# Patient Record
Sex: Male | Born: 1955 | Race: White | Hispanic: No | Marital: Married
Health system: Southern US, Community
[De-identification: ages and names within clinical notes are randomized; demographics above are authoritative.]

## PROBLEM LIST (undated history)

## (undated) DIAGNOSIS — I10 Essential (primary) hypertension: Secondary | ICD-10-CM

---

## 2014-01-11 ENCOUNTER — Other Ambulatory Visit: Payer: Self-pay | Admitting: Gastroenterology

## 2014-01-11 DIAGNOSIS — K639 Disease of intestine, unspecified: Secondary | ICD-10-CM

## 2014-01-22 ENCOUNTER — Ambulatory Visit
Admission: RE | Admit: 2014-01-22 | Discharge: 2014-01-22 | Disposition: A | Discharge status: Home / Self Care | Payer: BC Managed Care – PPO | Source: Ambulatory Visit | Attending: Gastroenterology | Admitting: Gastroenterology

## 2014-01-22 DIAGNOSIS — K639 Disease of intestine, unspecified: Secondary | ICD-10-CM

## 2014-01-22 MED ORDER — GADOBENATE DIMEGLUMINE 529 MG/ML IV SOLN
20.0000 mL | Freq: Once | INTRAVENOUS | Status: AC | PRN
  Administered 2014-01-22: 20 mL via INTRAVENOUS

## 2014-08-31 ENCOUNTER — Ambulatory Visit: Payer: Self-pay | Admitting: Rheumatology

## 2015-03-27 ENCOUNTER — Ambulatory Visit
Admission: RE | Admit: 2015-03-27 | Discharge: 2015-03-27 | Disposition: A | Discharge status: Home / Self Care | Payer: 59 | Source: Ambulatory Visit | Attending: Internal Medicine | Admitting: Internal Medicine

## 2015-03-27 ENCOUNTER — Other Ambulatory Visit: Payer: Self-pay | Admitting: Internal Medicine

## 2015-03-27 DIAGNOSIS — I2510 Atherosclerotic heart disease of native coronary artery without angina pectoris: Secondary | ICD-10-CM | POA: Insufficient documentation

## 2015-03-27 DIAGNOSIS — R042 Hemoptysis: Secondary | ICD-10-CM | POA: Diagnosis not present

## 2015-03-27 HISTORY — DX: Essential (primary) hypertension: I10

## 2015-03-27 LAB — POCT I-STAT CREATININE: CREATININE: 1.3 mg/dL — AB (ref 0.61–1.24)

## 2015-03-27 MED ORDER — IOHEXOL 350 MG/ML SOLN
100.0000 mL | Freq: Once | INTRAVENOUS | Status: AC | PRN
  Administered 2015-03-27: 100 mL via INTRAVENOUS

## 2015-03-29 ENCOUNTER — Other Ambulatory Visit: Payer: Self-pay | Admitting: Internal Medicine

## 2015-03-29 DIAGNOSIS — IMO0002 Reserved for concepts with insufficient information to code with codable children: Secondary | ICD-10-CM

## 2015-04-09 ENCOUNTER — Ambulatory Visit: Payer: 59

## 2015-04-17 ENCOUNTER — Ambulatory Visit: Payer: 59

## 2015-07-23 ENCOUNTER — Encounter: Payer: Self-pay | Admitting: Internal Medicine

## 2015-08-06 ENCOUNTER — Ambulatory Visit: Payer: Self-pay

## 2016-02-10 ENCOUNTER — Other Ambulatory Visit: Payer: Self-pay | Admitting: Internal Medicine

## 2016-02-10 DIAGNOSIS — M5416 Radiculopathy, lumbar region: Secondary | ICD-10-CM

## 2016-02-27 ENCOUNTER — Ambulatory Visit
Admission: RE | Admit: 2016-02-27 | Discharge: 2016-02-27 | Disposition: A | Discharge status: Home / Self Care | Payer: BLUE CROSS/BLUE SHIELD | Source: Ambulatory Visit | Attending: Internal Medicine | Admitting: Internal Medicine

## 2016-02-27 DIAGNOSIS — M4806 Spinal stenosis, lumbar region: Secondary | ICD-10-CM | POA: Insufficient documentation

## 2016-02-27 DIAGNOSIS — M5416 Radiculopathy, lumbar region: Secondary | ICD-10-CM | POA: Diagnosis not present

## 2016-02-27 MED ORDER — GADOBENATE DIMEGLUMINE 529 MG/ML IV SOLN
20.0000 mL | Freq: Once | INTRAVENOUS | Status: AC | PRN
  Administered 2016-02-27: 20 mL via INTRAVENOUS

## 2016-04-23 ENCOUNTER — Other Ambulatory Visit: Payer: Self-pay | Admitting: Physician Assistant

## 2016-04-23 ENCOUNTER — Ambulatory Visit
Admission: RE | Admit: 2016-04-23 | Discharge: 2016-04-23 | Disposition: A | Discharge status: Home / Self Care | Payer: BLUE CROSS/BLUE SHIELD | Source: Ambulatory Visit | Attending: Physician Assistant | Admitting: Physician Assistant

## 2016-04-23 DIAGNOSIS — M7989 Other specified soft tissue disorders: Secondary | ICD-10-CM

## 2016-04-23 DIAGNOSIS — M7121 Synovial cyst of popliteal space [Baker], right knee: Secondary | ICD-10-CM | POA: Diagnosis not present

## 2016-04-23 DIAGNOSIS — G8929 Other chronic pain: Secondary | ICD-10-CM | POA: Insufficient documentation

## 2016-04-23 DIAGNOSIS — M79604 Pain in right leg: Secondary | ICD-10-CM | POA: Diagnosis present

## 2016-05-11 ENCOUNTER — Other Ambulatory Visit: Payer: Self-pay | Admitting: Neurological Surgery

## 2016-05-11 DIAGNOSIS — M9963 Osseous and subluxation stenosis of intervertebral foramina of lumbar region: Secondary | ICD-10-CM

## 2016-05-12 ENCOUNTER — Ambulatory Visit
Admission: RE | Admit: 2016-05-12 | Discharge: 2016-05-12 | Disposition: A | Discharge status: Home / Self Care | Payer: BLUE CROSS/BLUE SHIELD | Source: Ambulatory Visit | Attending: Neurological Surgery | Admitting: Neurological Surgery

## 2016-05-12 DIAGNOSIS — M5418 Radiculopathy, sacral and sacrococcygeal region: Secondary | ICD-10-CM | POA: Diagnosis not present

## 2016-05-12 DIAGNOSIS — M9963 Osseous and subluxation stenosis of intervertebral foramina of lumbar region: Secondary | ICD-10-CM | POA: Diagnosis present

## 2016-05-12 DIAGNOSIS — M48061 Spinal stenosis, lumbar region without neurogenic claudication: Secondary | ICD-10-CM | POA: Insufficient documentation

## 2016-05-12 MED ORDER — GADOBENATE DIMEGLUMINE 529 MG/ML IV SOLN
20.0000 mL | Freq: Once | INTRAVENOUS | Status: AC | PRN
  Administered 2016-05-12: 20 mL via INTRAVENOUS

## 2017-06-10 IMAGING — MR MR LUMBAR SPINE WO/W CM
4 of 7 series · 14 of 48 positions shown · IV contrast (multihance)
Comparison: 02/27/2016

CLINICAL DATA: Postoperative pain. Low back pain extends down the
right leg with numbness. Lumbar spine decompression 04/14/2016

EXAM:
MRI LUMBAR SPINE WITHOUT AND WITH CONTRAST
TECHNIQUE: Multiplanar and multiecho pulse sequences of the lumbar spine were
obtained without and with intravenous contrast.
CONTRAST:  20mL MULTIHANCE GADOBENATE DIMEGLUMINE 529 MG/ML IV SOLN

[Series 2: T2 · sagittal · 4.0mm · 0.44mm/px · 5 of 18 slices shown (1 of 2)]
[im 1/18]
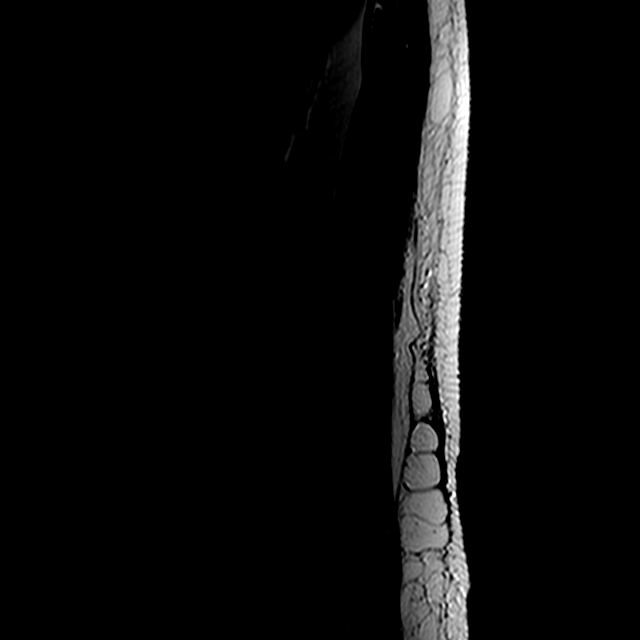
[im 5/18]
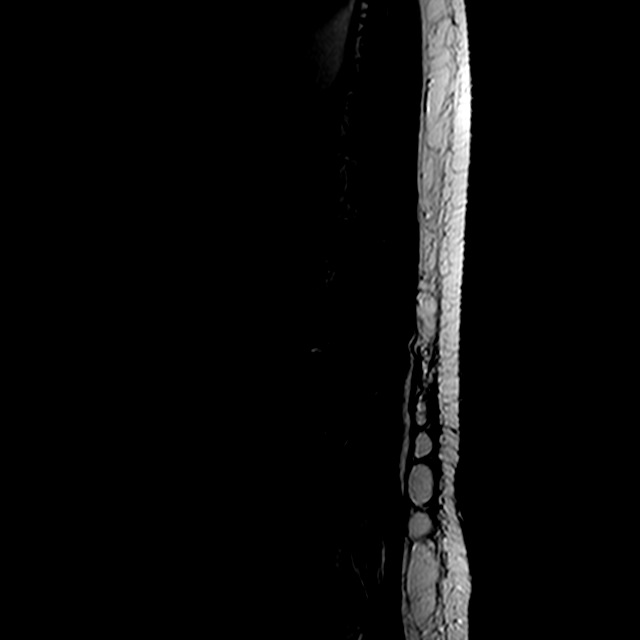
[im 9/18]
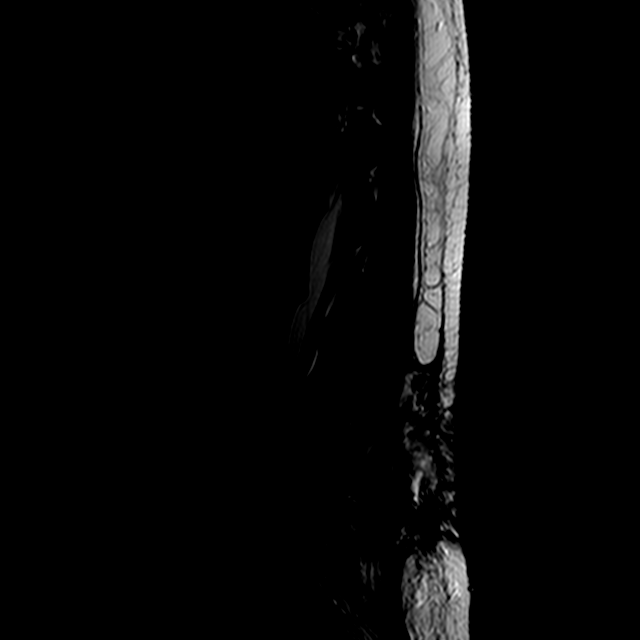
[im 13/18]
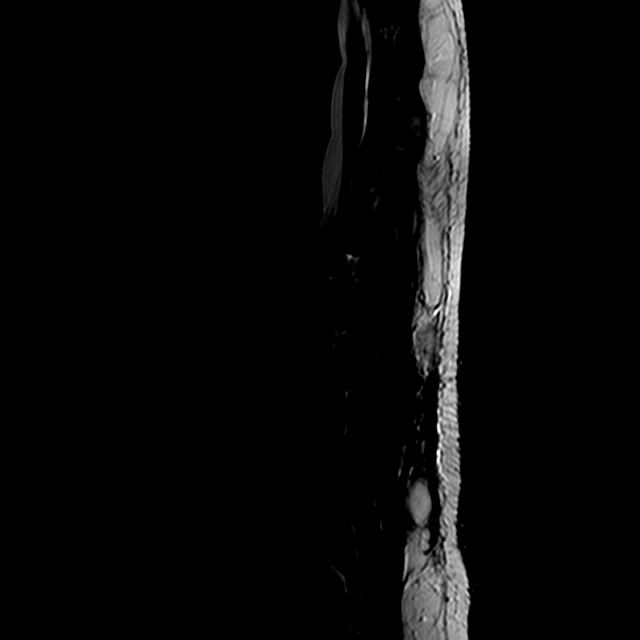
[im 18/18]
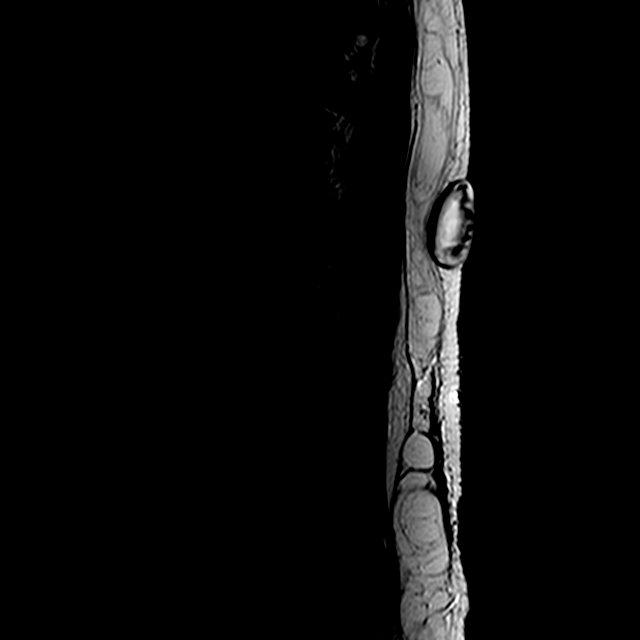

[Series 3: T1 · sagittal · 4.0mm · 0.44mm/px · 3 of 18 slices shown (1 of 2)]
[im 1/18]
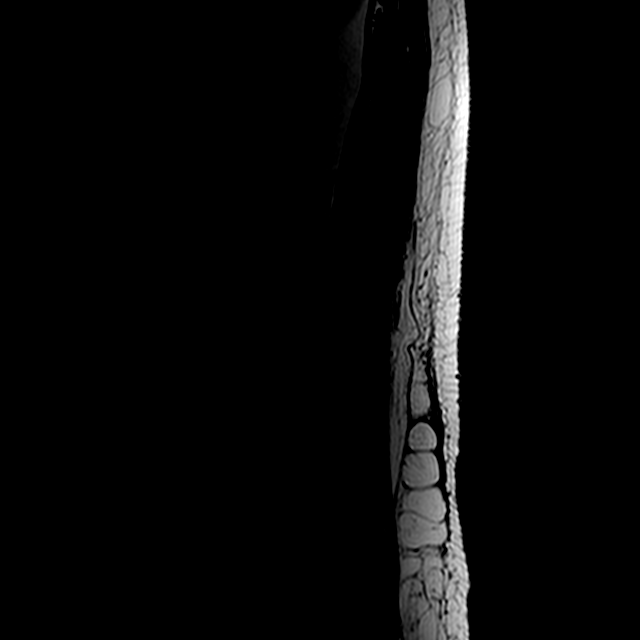
[im 9/18]
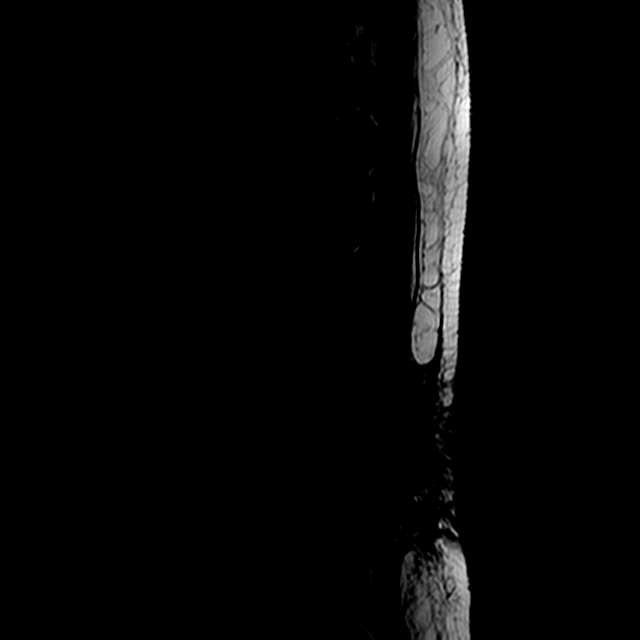
[im 18/18]
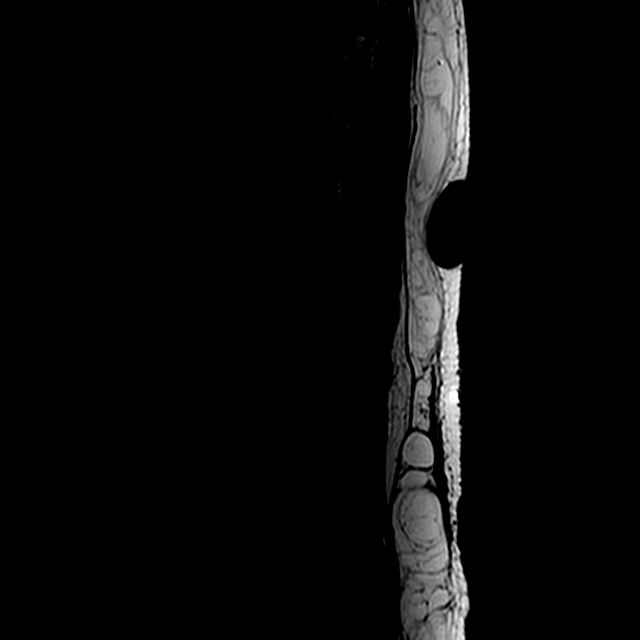

[Series 5: T2 · axial · 4.0mm · 0.39mm/px · z∈[-71,+114]mm · 3 of 41 slices shown (2 of 2)]
[im 5/41]
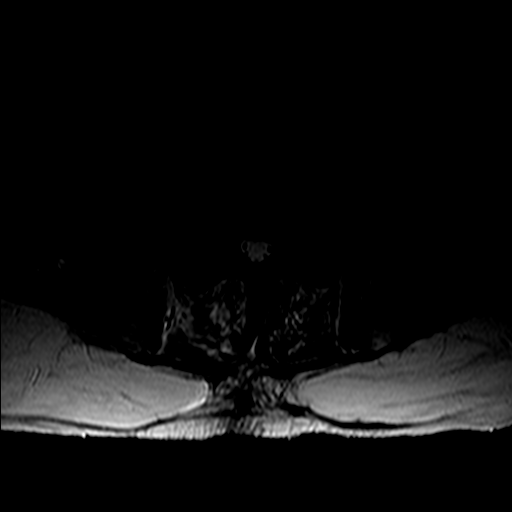
[im 23/41]
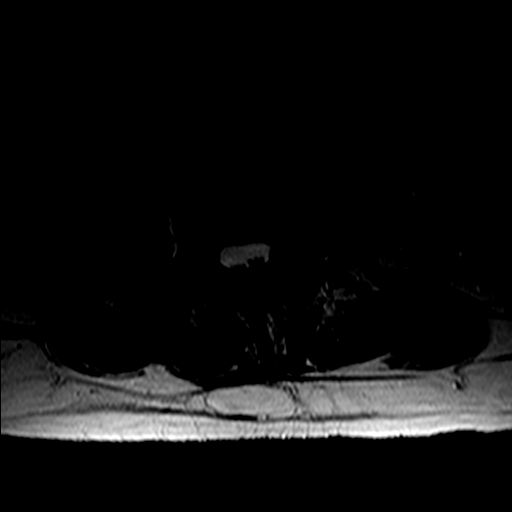
[im 36/41]
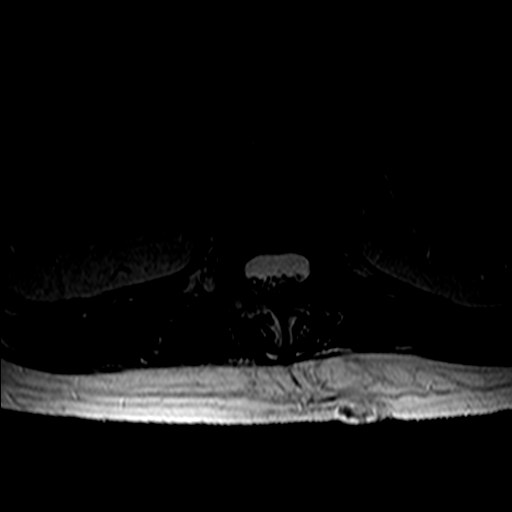

[Series 6: T1 · axial · 4.0mm · 0.39mm/px · z∈[-71,+114]mm · 3 of 41 slices shown (2 of 2)]
[im 5/41]
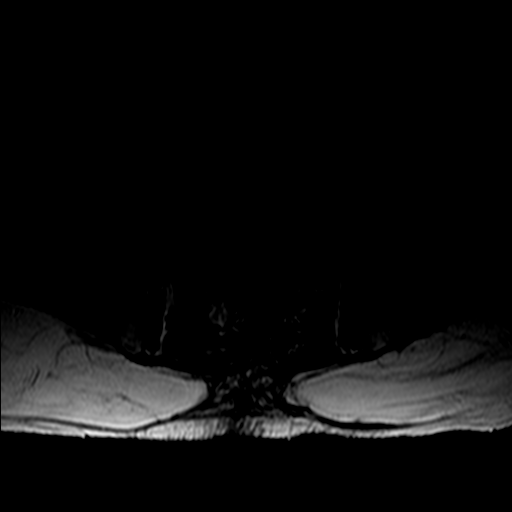
[im 23/41]
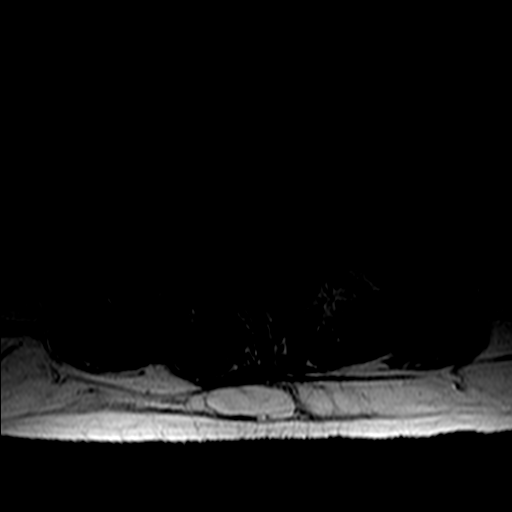
[im 36/41]
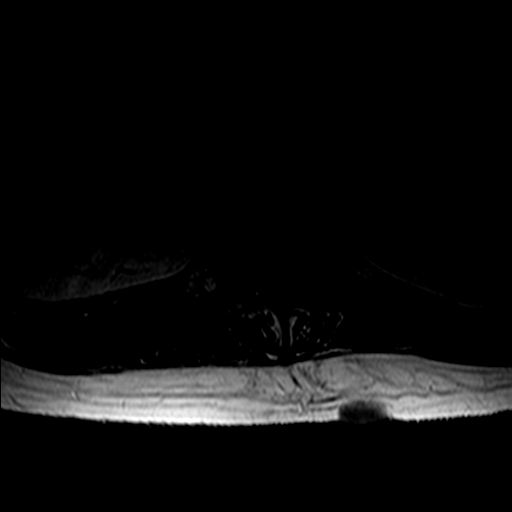

[14 of 48 positions shown; findings below may reference images not displayed]

FINDINGS: Segmentation:  From standard

Alignment:  Slight L4-5 anterolisthesis, chronic

Vertebrae: Postoperative findings described below. Interval L4-5
decompressive laminectomy and partial facetectomy. Marrow edema in
the postoperative right more than left L4-5 facets. No acute
fracture or discitis.

Conus medullaris: Extends to the T12-L1 level and appears normal.

The right S1 nerve is enhancing throughout its intrathecal course,
consistent with neuritis. Intrathecal vessels can also have this
appearance, but 2 distinct enhancing nerve roots are visible at some
levels.

Paraspinal and other soft tissues: Incisional fluid in the
laminectomy bed extending to the subcutaneous incision, without
tense appearance. No mass effect on the thecal sac.

Dermal inclusion cysts in the subcutaneous left para midline back at
the level of L1-2, 34 mm in maximal diameter.

Disc levels:

T12- L1: Unremarkable.

L1-L2: Disc narrowing and ventral endplate spurring. Posterior
annular fissure. No impingement

L2-L3: Advanced disc narrowing with bulky far-lateral spurs,
especially leftward, which causes ankylosis. Negative facets. No
impingement

L3-L4: Disc narrowing with left preferential bulging and endplate
spurring. Facet hypertrophy and left more than right ligamentum
flavum thickening. Spinal stenosis is moderate. Patent foramina

L4-L5: Interval decompressive laminectomy. CSF is now visible. The
subarticular recesses remain effaced, but the descending L5 nerves
are medial and not compressed. Patent foramina

L5-S1:Disc narrowing and mild bulging. Right paracentral disc
protrusion. Bilateral subarticular recess stenosis, greater on the
right. Negative foramina. Facet arthropathy with mild spurring.
IMPRESSION: 1. Right S1 neuritis on postcontrast images.
2. L4-5 posterior decompression with resolved severe spinal
stenosis. Subarticular recesses are effaced but the L5 nerves are
medial and not compressed. Nonspecific incisional fluid without mass
effect.
3. L5-S1 chronic right more than left subarticular recess stenosis,
discogenic.
4. L3-4 moderate spinal stenosis.

## 2019-02-15 ENCOUNTER — Ambulatory Visit: Payer: Self-pay | Admitting: Urology

## 2021-04-22 ENCOUNTER — Ambulatory Visit: Payer: Self-pay | Admitting: Urology

## 2021-05-13 ENCOUNTER — Ambulatory Visit: Payer: Self-pay | Admitting: Urology

## 2021-05-14 ENCOUNTER — Ambulatory Visit: Payer: Self-pay | Admitting: Urology
